# Patient Record
Sex: Female | Born: 1965 | Race: Asian | Hispanic: No | Marital: Married | Smoking: Former smoker
Health system: Southern US, Community
[De-identification: ages and names within clinical notes are randomized; demographics above are authoritative.]

## PROBLEM LIST (undated history)

## (undated) DIAGNOSIS — C801 Malignant (primary) neoplasm, unspecified: Secondary | ICD-10-CM

## (undated) DIAGNOSIS — F329 Major depressive disorder, single episode, unspecified: Secondary | ICD-10-CM

## (undated) DIAGNOSIS — E119 Type 2 diabetes mellitus without complications: Secondary | ICD-10-CM

## (undated) DIAGNOSIS — R112 Nausea with vomiting, unspecified: Secondary | ICD-10-CM

## (undated) DIAGNOSIS — R569 Unspecified convulsions: Secondary | ICD-10-CM

## (undated) DIAGNOSIS — K589 Irritable bowel syndrome without diarrhea: Secondary | ICD-10-CM

## (undated) DIAGNOSIS — Z8489 Family history of other specified conditions: Secondary | ICD-10-CM

## (undated) DIAGNOSIS — Z9889 Other specified postprocedural states: Secondary | ICD-10-CM

## (undated) DIAGNOSIS — F32A Depression, unspecified: Secondary | ICD-10-CM

## (undated) HISTORY — PX: DILATION AND CURETTAGE OF UTERUS: SHX78

## (undated) HISTORY — PX: OTHER SURGICAL HISTORY: SHX169

## (undated) HISTORY — PX: ENDOMETRIAL ABLATION: SHX621

## (undated) HISTORY — PX: LAPAROSCOPY: SHX197

---

## 1997-09-17 ENCOUNTER — Other Ambulatory Visit: Admission: RE | Admit: 1997-09-17 | Discharge: 1997-09-17 | Payer: Self-pay | Admitting: Obstetrics & Gynecology

## 1997-12-06 ENCOUNTER — Emergency Department (HOSPITAL_COMMUNITY): Admission: EM | Admit: 1997-12-06 | Discharge: 1997-12-06 | Payer: Self-pay | Admitting: Family Medicine

## 1998-09-14 ENCOUNTER — Other Ambulatory Visit: Admission: RE | Admit: 1998-09-14 | Discharge: 1998-09-14 | Payer: Self-pay | Admitting: Obstetrics & Gynecology

## 1999-09-21 ENCOUNTER — Other Ambulatory Visit: Admission: RE | Admit: 1999-09-21 | Discharge: 1999-09-21 | Payer: Self-pay | Admitting: Obstetrics & Gynecology

## 2000-11-27 ENCOUNTER — Other Ambulatory Visit: Admission: RE | Admit: 2000-11-27 | Discharge: 2000-11-27 | Payer: Self-pay | Admitting: Obstetrics & Gynecology

## 2001-04-20 ENCOUNTER — Encounter: Payer: Self-pay | Admitting: Internal Medicine

## 2001-04-20 ENCOUNTER — Encounter: Admission: RE | Admit: 2001-04-20 | Discharge: 2001-04-20 | Payer: Self-pay | Admitting: Internal Medicine

## 2002-01-03 ENCOUNTER — Other Ambulatory Visit: Admission: RE | Admit: 2002-01-03 | Discharge: 2002-01-03 | Payer: Self-pay | Admitting: Obstetrics & Gynecology

## 2002-10-29 ENCOUNTER — Encounter (HOSPITAL_COMMUNITY): Admission: RE | Admit: 2002-10-29 | Discharge: 2003-01-27 | Payer: Self-pay | Admitting: Internal Medicine

## 2002-10-30 ENCOUNTER — Encounter: Payer: Self-pay | Admitting: Internal Medicine

## 2002-11-06 ENCOUNTER — Ambulatory Visit (HOSPITAL_COMMUNITY): Admission: RE | Admit: 2002-11-06 | Discharge: 2002-11-06 | Payer: Self-pay | Admitting: Gastroenterology

## 2002-11-06 ENCOUNTER — Encounter (INDEPENDENT_AMBULATORY_CARE_PROVIDER_SITE_OTHER): Payer: Self-pay | Admitting: Specialist

## 2003-02-06 ENCOUNTER — Other Ambulatory Visit: Admission: RE | Admit: 2003-02-06 | Discharge: 2003-02-06 | Payer: Self-pay | Admitting: Obstetrics & Gynecology

## 2003-02-10 ENCOUNTER — Ambulatory Visit (HOSPITAL_COMMUNITY): Admission: RE | Admit: 2003-02-10 | Discharge: 2003-02-10 | Payer: Self-pay | Admitting: Endocrinology

## 2003-02-10 ENCOUNTER — Encounter: Payer: Self-pay | Admitting: Endocrinology

## 2004-03-16 ENCOUNTER — Other Ambulatory Visit: Admission: RE | Admit: 2004-03-16 | Discharge: 2004-03-16 | Payer: Self-pay | Admitting: Obstetrics & Gynecology

## 2004-05-28 ENCOUNTER — Encounter (INDEPENDENT_AMBULATORY_CARE_PROVIDER_SITE_OTHER): Payer: Self-pay | Admitting: *Deleted

## 2004-05-28 ENCOUNTER — Ambulatory Visit (HOSPITAL_COMMUNITY): Admission: RE | Admit: 2004-05-28 | Discharge: 2004-05-28 | Payer: Self-pay | Admitting: Obstetrics & Gynecology

## 2004-10-30 ENCOUNTER — Emergency Department (HOSPITAL_COMMUNITY): Admission: EM | Admit: 2004-10-30 | Discharge: 2004-10-30 | Payer: Self-pay | Admitting: Emergency Medicine

## 2004-12-02 ENCOUNTER — Encounter: Admission: RE | Admit: 2004-12-02 | Discharge: 2004-12-02 | Payer: Self-pay | Admitting: Internal Medicine

## 2004-12-06 ENCOUNTER — Encounter: Admission: RE | Admit: 2004-12-06 | Discharge: 2004-12-06 | Payer: Self-pay | Admitting: Gastroenterology

## 2005-04-20 ENCOUNTER — Other Ambulatory Visit: Admission: RE | Admit: 2005-04-20 | Discharge: 2005-04-20 | Payer: Self-pay | Admitting: Obstetrics & Gynecology

## 2008-01-07 ENCOUNTER — Encounter: Admission: RE | Admit: 2008-01-07 | Discharge: 2008-01-07 | Payer: Self-pay | Admitting: Obstetrics & Gynecology

## 2009-01-06 ENCOUNTER — Encounter: Admission: RE | Admit: 2009-01-06 | Discharge: 2009-01-06 | Payer: Self-pay | Admitting: Obstetrics & Gynecology

## 2009-05-26 ENCOUNTER — Observation Stay (HOSPITAL_COMMUNITY): Admission: EM | Admit: 2009-05-26 | Discharge: 2009-05-27 | Payer: Self-pay | Admitting: Emergency Medicine

## 2009-05-27 ENCOUNTER — Encounter (INDEPENDENT_AMBULATORY_CARE_PROVIDER_SITE_OTHER): Payer: Self-pay | Admitting: Internal Medicine

## 2009-07-30 ENCOUNTER — Encounter: Admission: RE | Admit: 2009-07-30 | Discharge: 2009-07-30 | Payer: Self-pay | Admitting: Obstetrics & Gynecology

## 2009-08-21 IMAGING — US US BREAST*L*
1 series · 5 of 5 positions shown · non-contrast
Comparison: Mammograms December 31, 2008,December 29, 2007,November 21, 2003.

On physical exam, no mass is palpated in the periareolar medial
left breast

CLINICAL DATA: Small nodule identified in the periareolar left
breast on recent screening mammogram on December 31, 2008.

LEFT BREAST ULTRASOUND

[Series 1: us breast*left* · 5 of 5 slices shown]
[im 1/5]
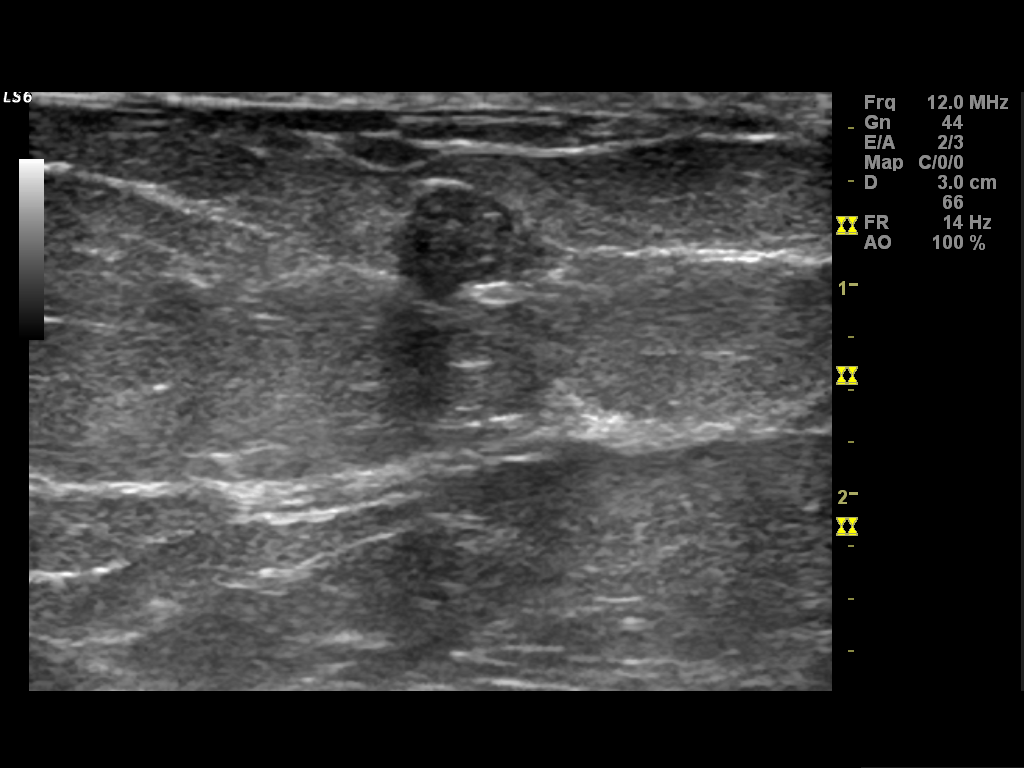
[im 2/5]
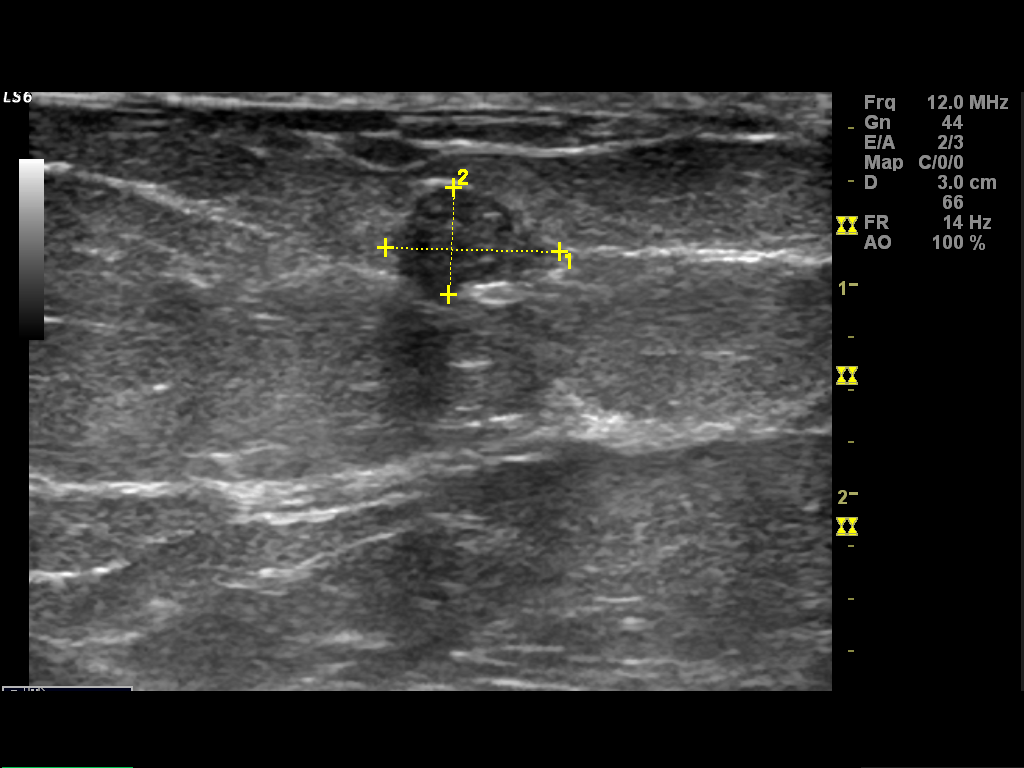
[im 3/5]
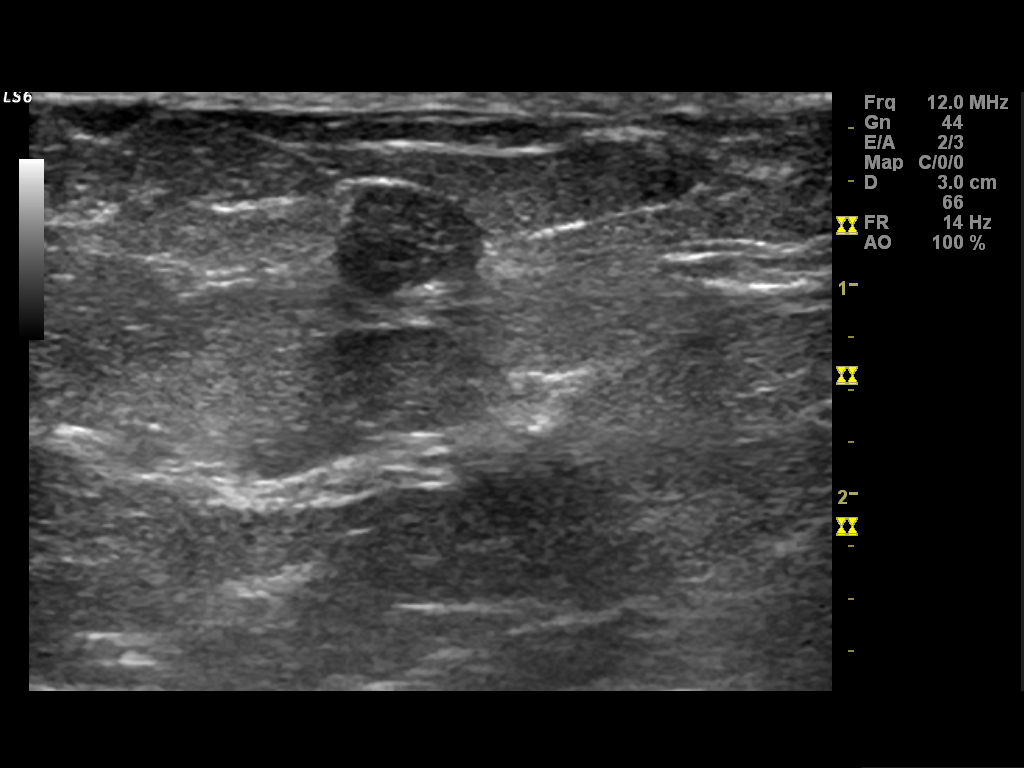
[im 4/5]
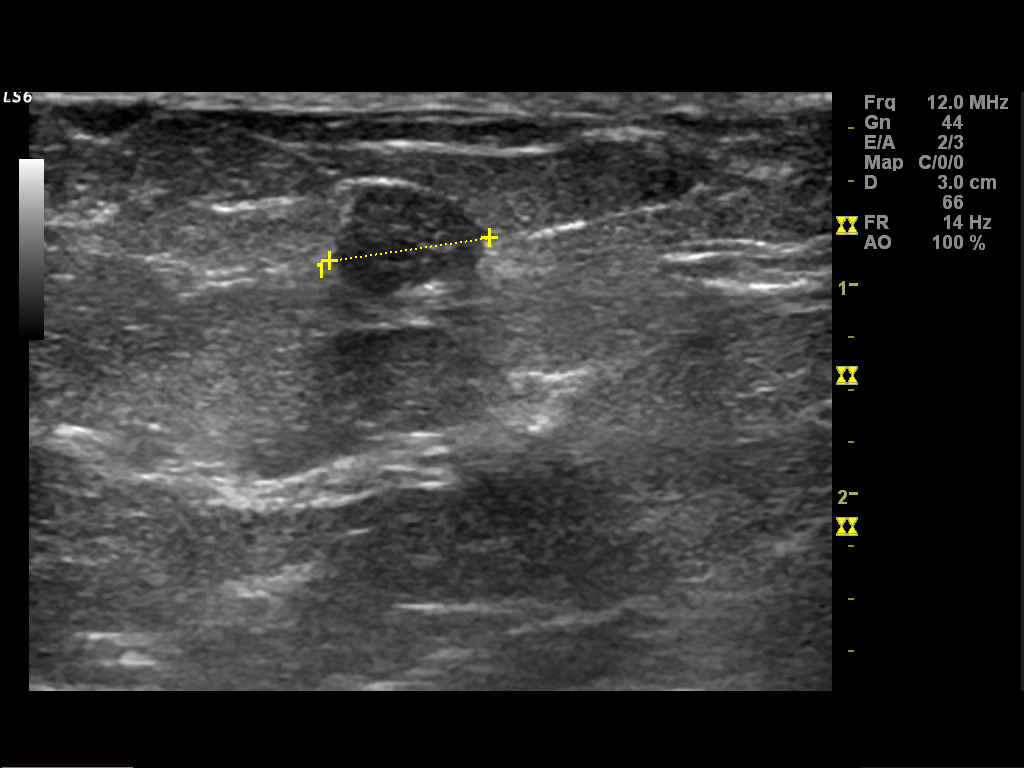
[im 5/5]
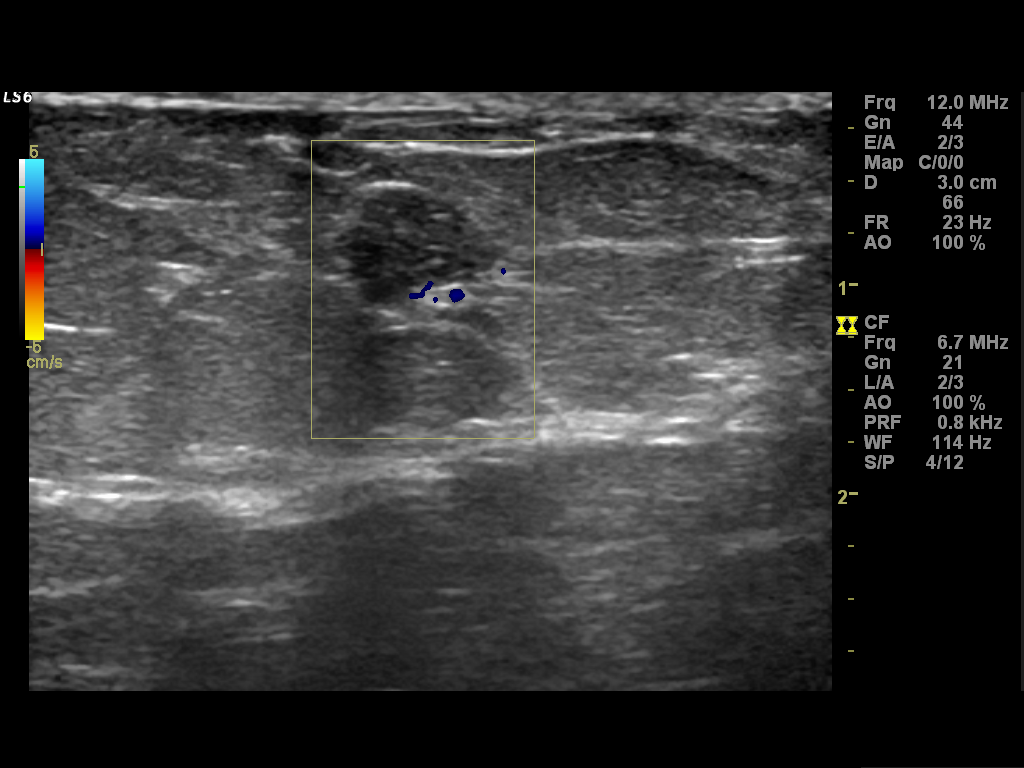

[5 of 5 positions shown; findings below may reference images not displayed]

FINDINGS: Ultrasound is performed, showing a circumscribed slightly
hypoechoic oval circumscribed mass that measures 8 x 5 x 5 mm,
located at [DATE] position 1 cm from the nipple.  On the recent
screening mammogram, this nodule appears stable compared to the
December 2007 mammograms.  This is favored to be a benign nodule,
likely a small fibroadenoma.
IMPRESSION: Probably benign 8 mm nodule [DATE] position left breast periareolar.
A follow-up left breast ultrasound in 6 months is recommended.  The
option of ultrasound-guided core needle biopsy is also discussed
with the patient, but she prefers imaging follow-up.

BI-RADS CATEGORY 3:  Probably benign finding(s) - short interval
follow-up suggested.

.

## 2010-06-14 ENCOUNTER — Encounter: Payer: Self-pay | Admitting: Obstetrics & Gynecology

## 2010-08-08 LAB — CULTURE, BLOOD (ROUTINE X 2)
Culture: NO GROWTH
Culture: NO GROWTH

## 2010-08-08 LAB — COMPREHENSIVE METABOLIC PANEL
AST: 31 U/L (ref 0–37)
Albumin: 3.6 g/dL (ref 3.5–5.2)
Alkaline Phosphatase: 71 U/L (ref 39–117)
BUN: 8 mg/dL (ref 6–23)
Calcium: 8.6 mg/dL (ref 8.4–10.5)
Creatinine, Ser: 0.78 mg/dL (ref 0.4–1.2)
GFR calc non Af Amer: >60 mL/min (ref >60)

## 2010-08-08 LAB — URINE CULTURE
Colony Count: NO GROWTH
Culture: NO GROWTH

## 2010-08-08 LAB — CBC
HCT: 42.5 % (ref 36.0–46.0)
Hemoglobin: 11.8 g/dL — ABNORMAL LOW (ref 12.0–15.0)
Hemoglobin: 14.4 g/dL (ref 12.0–15.0)
MCHC: 33.6 g/dL (ref 30.0–36.0)
MCHC: 33.8 g/dL (ref 30.0–36.0)
MCV: 83.5 fL (ref 78.0–100.0)
MCV: 83.9 fL (ref 78.0–100.0)
Platelets: 315 K/uL (ref 150–400)
RBC: 5.09 MIL/uL (ref 3.87–5.11)
RDW: 15.1 % (ref 11.5–15.5)
WBC: 13 K/uL — ABNORMAL HIGH (ref 4.0–10.5)

## 2010-08-08 LAB — URINALYSIS, ROUTINE W REFLEX MICROSCOPIC
Bilirubin Urine: NEGATIVE
Glucose, UA: NEGATIVE mg/dL
Hgb urine dipstick: NEGATIVE
Ketones, ur: NEGATIVE mg/dL
Nitrite: NEGATIVE
Protein, ur: NEGATIVE mg/dL
Specific Gravity, Urine: 1.013 (ref 1.005–1.030)
Urobilinogen, UA: 1 mg/dL (ref 0.0–1.0)
pH: 6 (ref 5.0–8.0)

## 2010-08-08 LAB — COMPREHENSIVE METABOLIC PANEL WITH GFR
ALT: 20 U/L (ref 0–35)
CO2: 25 meq/L (ref 19–32)
Chloride: 104 meq/L (ref 96–112)
GFR calc Af Amer: >60 mL/min (ref >60)
Glucose, Bld: 112 mg/dL — ABNORMAL HIGH (ref 70–99)
Potassium: 2.9 meq/L — ABNORMAL LOW (ref 3.5–5.1)
Sodium: 138 meq/L (ref 135–145)
Total Bilirubin: 0.8 mg/dL (ref 0.3–1.2)
Total Protein: 6.9 g/dL (ref 6.0–8.3)

## 2010-08-08 LAB — DIFFERENTIAL
Basophils Absolute: 0 K/uL (ref 0.0–0.1)
Basophils Relative: 0 % (ref 0–1)
Eosinophils Absolute: 0 K/uL (ref 0.0–0.7)
Eosinophils Relative: 0 % (ref 0–5)
Lymphocytes Relative: 3 % — ABNORMAL LOW (ref 12–46)
Lymphs Abs: 0.4 K/uL — ABNORMAL LOW (ref 0.7–4.0)
Monocytes Absolute: 0.1 10*3/uL (ref 0.1–1.0)
Monocytes Relative: 1 % — ABNORMAL LOW (ref 3–12)
Neutro Abs: 12.4 10*3/uL — ABNORMAL HIGH (ref 1.7–7.7)
Neutrophils Relative %: 96 % — ABNORMAL HIGH (ref 43–77)

## 2010-08-08 LAB — D-DIMER, QUANTITATIVE: D-Dimer, Quant: 0.45 ug{FEU}/mL (ref 0.00–0.48)

## 2010-08-08 LAB — LIPASE, BLOOD: Lipase: 26 U/L (ref 11–59)

## 2010-08-08 LAB — URINE MICROSCOPIC-ADD ON

## 2010-08-08 LAB — BASIC METABOLIC PANEL
BUN: 4 mg/dL — ABNORMAL LOW (ref 6–23)
CO2: 18 mEq/L — ABNORMAL LOW (ref 19–32)
Calcium: 7.7 mg/dL — ABNORMAL LOW (ref 8.4–10.5)
Creatinine, Ser: 0.79 mg/dL (ref 0.4–1.2)
GFR calc Af Amer: >60 mL/min (ref >60)
Glucose, Bld: 156 mg/dL — ABNORMAL HIGH (ref 70–99)
Potassium: 3.9 mEq/L (ref 3.5–5.1)
Sodium: 135 mEq/L (ref 135–145)

## 2010-08-08 LAB — LACTIC ACID, PLASMA: Lactic Acid, Venous: 4 mmol/L — ABNORMAL HIGH (ref 0.5–2.2)

## 2010-10-08 NOTE — Op Note (Signed)
NAMEZARRA, GEFFERT NO.:  [id number]   MEDICAL RECORD NO.:  [id number]          PATIENT TYPE:  AMB   LOCATION:  SDC                           FACILITY:  [facility]   PHYSICIAN:  Freddy Finner, M.D.   DATE OF BIRTH:  03-08-1966   DATE OF PROCEDURE:  05/28/2004  DATE OF DISCHARGE:                                 OPERATIVE REPORT   PREOPERATIVE DIAGNOSIS:  Menorrhagia.   POSTOPERATIVE DIAGNOSIS:  Menorrhagia.   OPERATIVE PROCEDURE:  Hysteroscopy, D&C, and NovaSure endometrial ablation.   ANESTHESIA:  General.   ESTIMATED INTRAOPERATIVE BLOOD LOSS:  10 cubic centimeters or less.   LACTATED RINGERS DEFICIT:  Was 60 cubic centimeters.   INTRAOPERATIVE COMPLICATIONS:  None.   The patient is a 45 year old with menorrhagia with no ultrasound histogram  evidence of intracavitary abnormality.  Clinically, the uterus is a little  boggy and slightly enlarged but there was no uterine pathology except for  some small intramural myomata.  The patient is admitted now for treatment of  menorrhagia.   She was admitted on the morning of surgery.  She was given an antibiotic  bolus preoperatively.  She was brought to the operating room, placed under  adequate general anesthesia, placed in the dorsal lithotomy position using  the Dry Prong stirrups system.  Betadine prep was carried out in the usual  fashion.  The bivalved speculum was introduced.  The cervix was visualized  and grasped on the anterior lip with a single-toothed tenaculum.  The cervix  sounded to 2.5 cm.  The cervix sounded to 8.5 cm.  The cervix was  progressively dilated with Shawnie Pons dilators to #23.  The uterus was  retroverted.  Inspection with the hysteroscope using lactated Ringer  solution revealed a normal appearing cavity.  Gentle, thorough curettage was  carried with exploration with the Randall stone forceps to recover  endometrial tissue.  The NovaSure device was then applied.  The cavity  length was 6.5,  cavity width was 2.8.  Power consumed was 100.  Time of  ablation was one minute 30 seconds.  After adequately applying and testing  of the device, the NovaSure ablation was carried out without difficulty.  Re-  inspection of the cavity revealed complete ablation of the endometrium.  The  procedure was terminated.  The instruments were removed and the patient was  awakened, taken to the recovery room in good condition.  She will be  discharged in the immediate postoperative period for follow up in the office  in approximately two weeks.  She was given Toradol 30 mg IV, 30 mg IM  immediately after completing the procedure for postoperative analgesia.     Hosie Spangle   WRN/MEDQ  D:  05/28/2004  T:  05/28/2004  Job:  [id number]

## 2010-10-08 NOTE — Op Note (Signed)
NAMENICOL, HERBIG NO.:  [id number]   MEDICAL RECORD NO.:  [id number]                   PATIENT TYPE:  AMB   LOCATION:  ENDO                                 FACILITY:  [facility]   PHYSICIAN:  Danise Edge, M.D.                DATE OF BIRTH:  [birth year]   DATE OF PROCEDURE:  11/06/2002  DATE OF DISCHARGE:                                 OPERATIVE REPORT   REFERRING PHYSICIAN:  Candyce Churn, M.D.   PROCEDURE:  Esophagogastroduodenoscopy.   PROCEDURE INDICATION:  Ms. Megan Clark is a 45 year old female, born May 20, 1966.  Ms. Megan Clark has epigastric pain despite taking Nexium.  She reports  no heartburn, dysphagia, or odynophagia.  In the past, she was told she had  peptic ulcer disease by upper GI x-ray series.  She tells me her gallbladder  ultrasound a year ago was normal.   ENDOSCOPIST:  Charolett Bumpers, M.D.   PREMEDICATION:  1. Versed 10 mg.  2. Fentanyl 100 mcg.   DESCRIPTION OF PROCEDURE:  After obtaining informed consent, Ms. Megan Clark was  placed in the left lateral decubitus position.  I administered intravenous  fentanyl and intravenous Versed to achieve conscious sedation for the  procedure.  The patient's blood pressure, oxygen saturation, and cardiac  rhythm were monitored throughout the procedure and documented in the medical  record.   The Olympus gastroscope was passed through the posterior hypopharynx into  the proximal esophagus without difficulty.  The hypopharynx, larynx, and  vocal cords appeared normal.   ESOPHAGOSCOPY:  Inspection of the proximal, mid, and lower segments of the  esophageal mucosa was completely normal.  The squamocolumnar junction and  esophagogastric junction are noted at 35 cm from the incisor teeth.   GASTROSCOPY:  Retroflexed view of the gastric cardia and fundus was normal.  In the proximal gastric body, there are a few scattered, 1mm sessile polyps  which were biopsied.  The gastric  antrum, and pylorus appear normal.   DUODENOSCOPY:  The duodenal bulb, mid duodenum, distal duodenum, and  proximal jejunum appear normal.    ASSESSMENT:  Essentially normal esophagogastroduodenoscopy except for the  presence of a few diminutive gastric body polyps which were biopsied.  I  suspect these represent gastric glands but if H. pylori is present on the  microscope, I would recommend treatment to eradicate H. pylori.                                               Danise Edge, M.D.    MJ/MEDQ  D:  11/06/2002  T:  11/06/2002  Job:  [id number]   cc:   Candyce Churn, M.D.  [address] [place]  [place] [address]  Fax: [phone]

## 2013-11-24 ENCOUNTER — Other Ambulatory Visit: Payer: Self-pay | Admitting: Oncology

## 2013-12-05 ENCOUNTER — Other Ambulatory Visit: Payer: Self-pay | Admitting: Obstetrics & Gynecology

## 2013-12-05 DIAGNOSIS — R928 Other abnormal and inconclusive findings on diagnostic imaging of breast: Secondary | ICD-10-CM

## 2014-01-06 ENCOUNTER — Ambulatory Visit
Admission: RE | Admit: 2014-01-06 | Discharge: 2014-01-06 | Disposition: A | Discharge status: Home / Self Care | Payer: 59 | Source: Ambulatory Visit | Attending: Obstetrics & Gynecology | Admitting: Obstetrics & Gynecology

## 2014-01-06 ENCOUNTER — Other Ambulatory Visit: Payer: Self-pay | Admitting: Obstetrics & Gynecology

## 2014-01-06 ENCOUNTER — Encounter (INDEPENDENT_AMBULATORY_CARE_PROVIDER_SITE_OTHER): Payer: Self-pay

## 2014-01-06 DIAGNOSIS — N6310 Unspecified lump in the right breast, unspecified quadrant: Secondary | ICD-10-CM

## 2014-01-06 DIAGNOSIS — R928 Other abnormal and inconclusive findings on diagnostic imaging of breast: Secondary | ICD-10-CM

## 2014-07-16 ENCOUNTER — Other Ambulatory Visit: Payer: Self-pay | Admitting: Obstetrics & Gynecology

## 2014-07-16 DIAGNOSIS — N63 Unspecified lump in unspecified breast: Secondary | ICD-10-CM

## 2014-08-28 ENCOUNTER — Ambulatory Visit
Admission: RE | Admit: 2014-08-28 | Discharge: 2014-08-28 | Disposition: A | Discharge status: Home / Self Care | Payer: 59 | Source: Ambulatory Visit | Attending: Obstetrics & Gynecology | Admitting: Obstetrics & Gynecology

## 2014-08-28 DIAGNOSIS — N63 Unspecified lump in unspecified breast: Secondary | ICD-10-CM

## 2014-12-16 ENCOUNTER — Other Ambulatory Visit: Payer: Self-pay | Admitting: *Deleted

## 2014-12-16 MED ORDER — FLUCONAZOLE 100 MG PO TABS: 100.0000 mg | ORAL_TABLET | Freq: Every day | ORAL | Status: DC

## 2015-01-28 ENCOUNTER — Other Ambulatory Visit: Payer: Self-pay | Admitting: Obstetrics & Gynecology

## 2015-01-29 LAB — CYTOLOGY - PAP

## 2015-03-19 ENCOUNTER — Other Ambulatory Visit: Payer: Self-pay | Admitting: Gastroenterology

## 2015-04-12 IMAGING — MG MM DIAG BREAST TOMO UNI RIGHT
4 series · 4 of 12 positions shown · non-contrast
Comparison: 01/06/2014 and earlier

CLINICAL DATA: Six-month follow-up following benign right breast
biopsy showing fibroadenoma.

EXAM:
DIGITAL DIAGNOSTIC RIGHT MAMMOGRAM WITH 3D TOMOSYNTHESIS AND CAD

[R MLO]
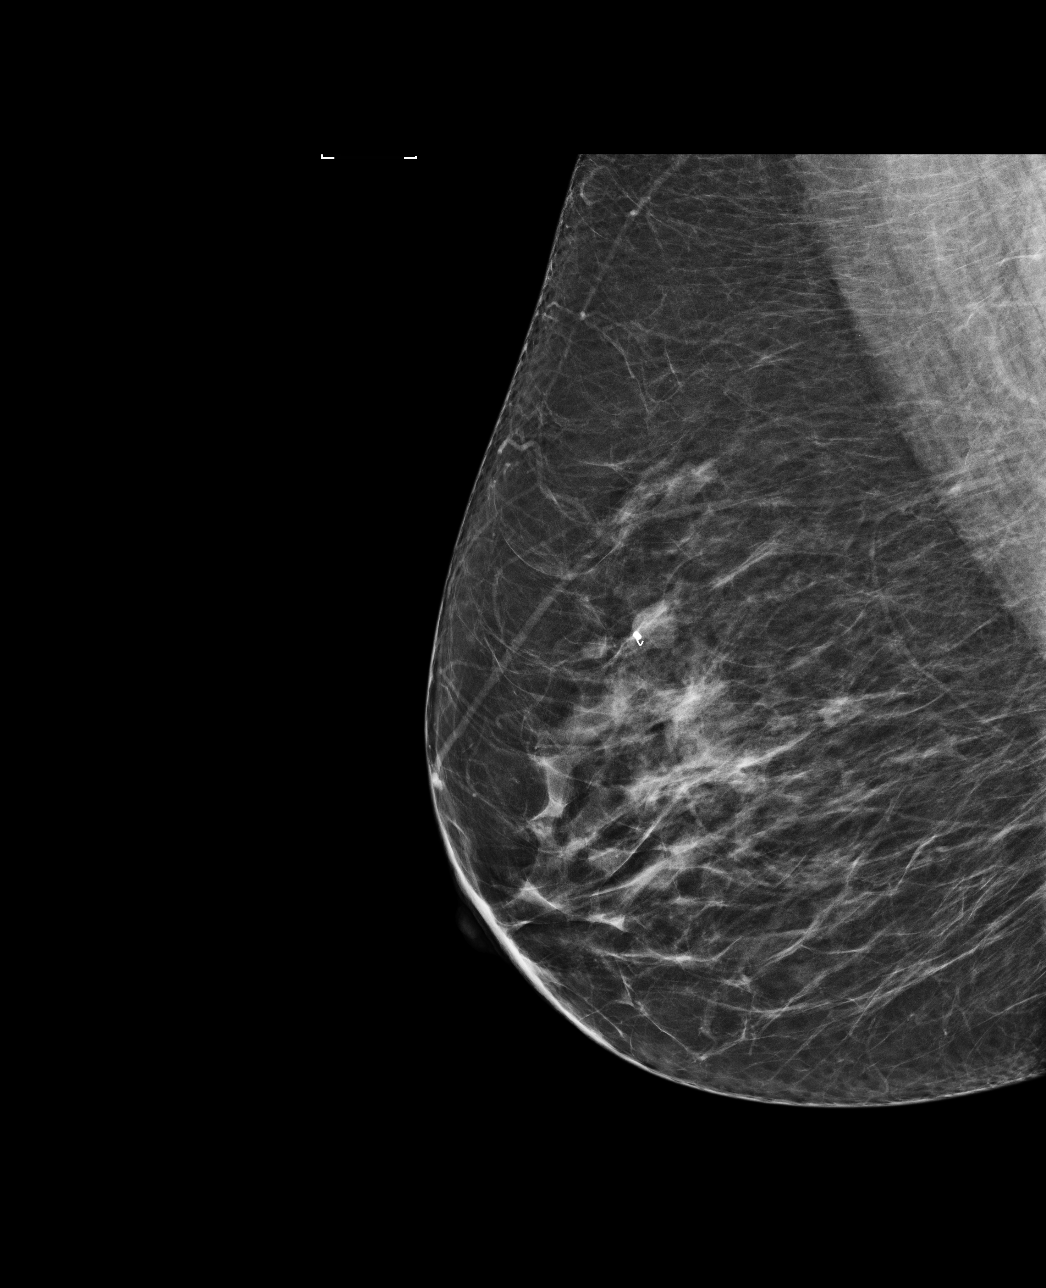

[R CC]
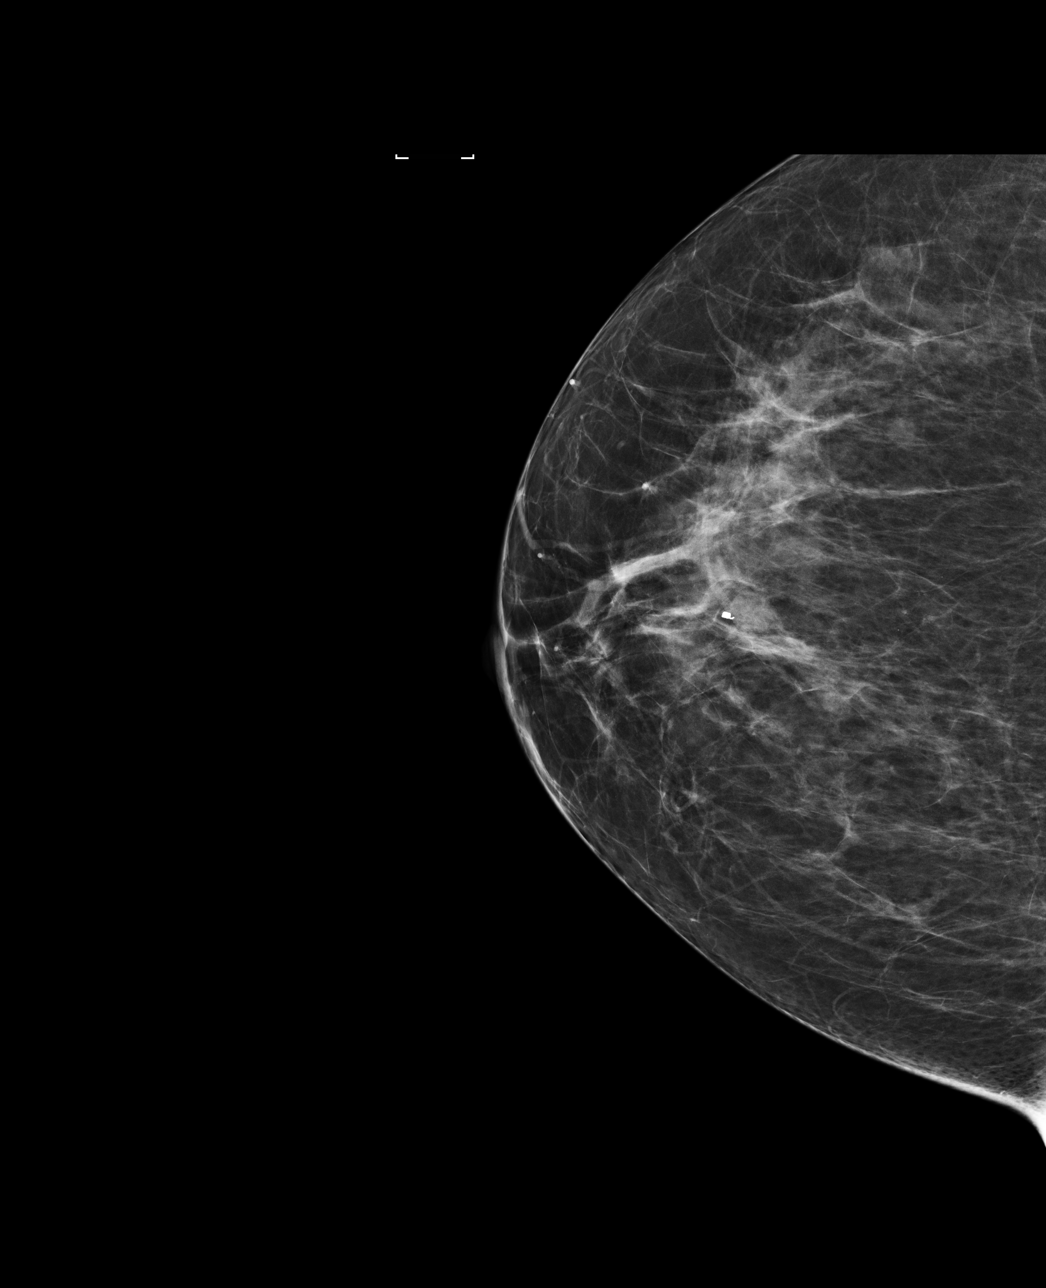

[R CC tomo · tomo slice 40/79.0]
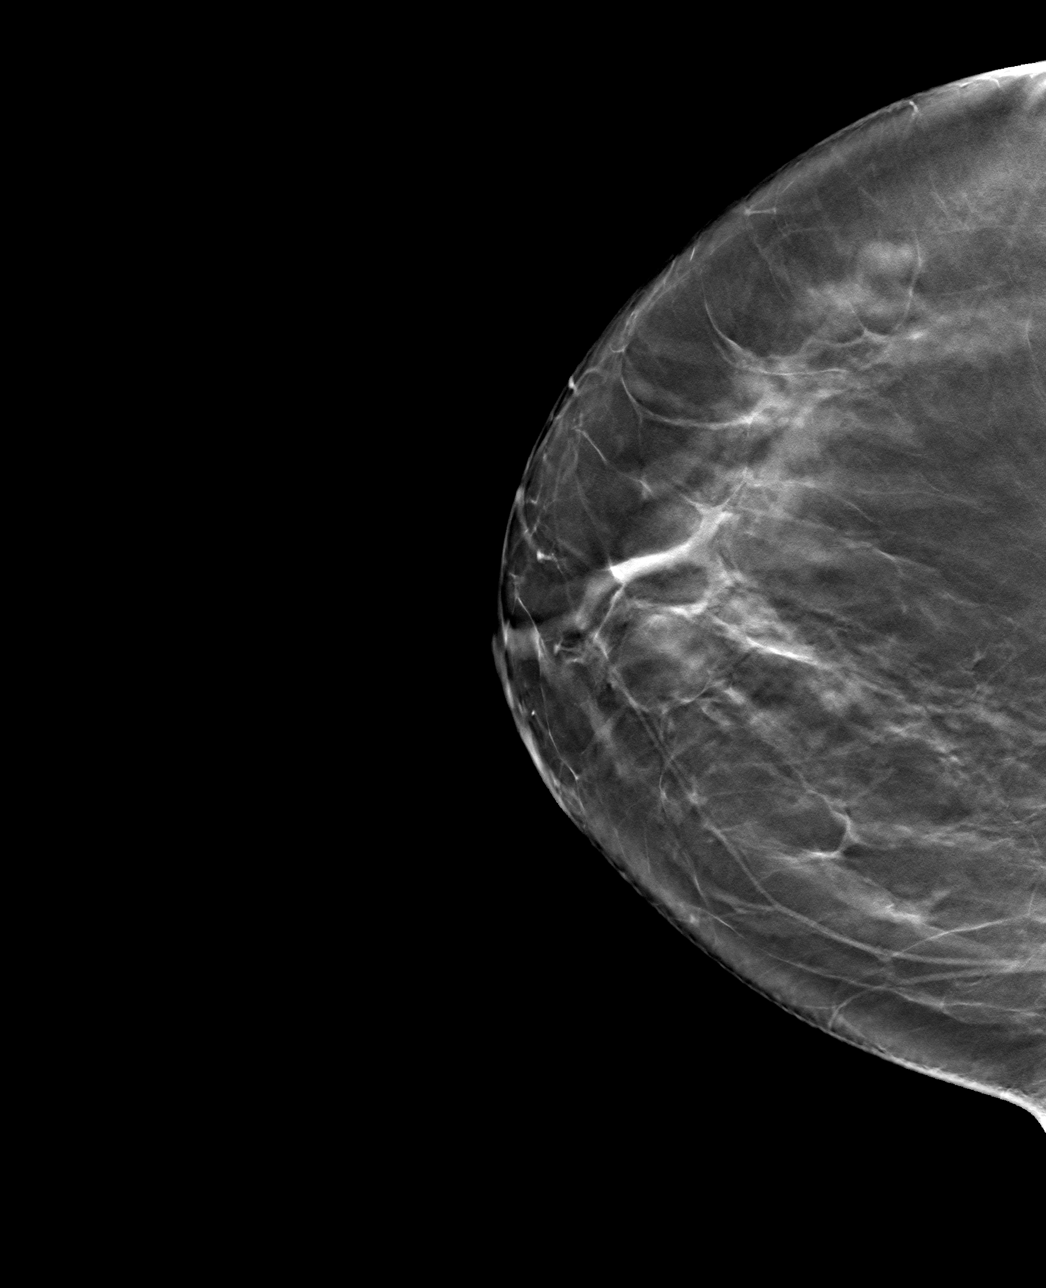

[R MLO tomo · tomo slice 39/77.0]
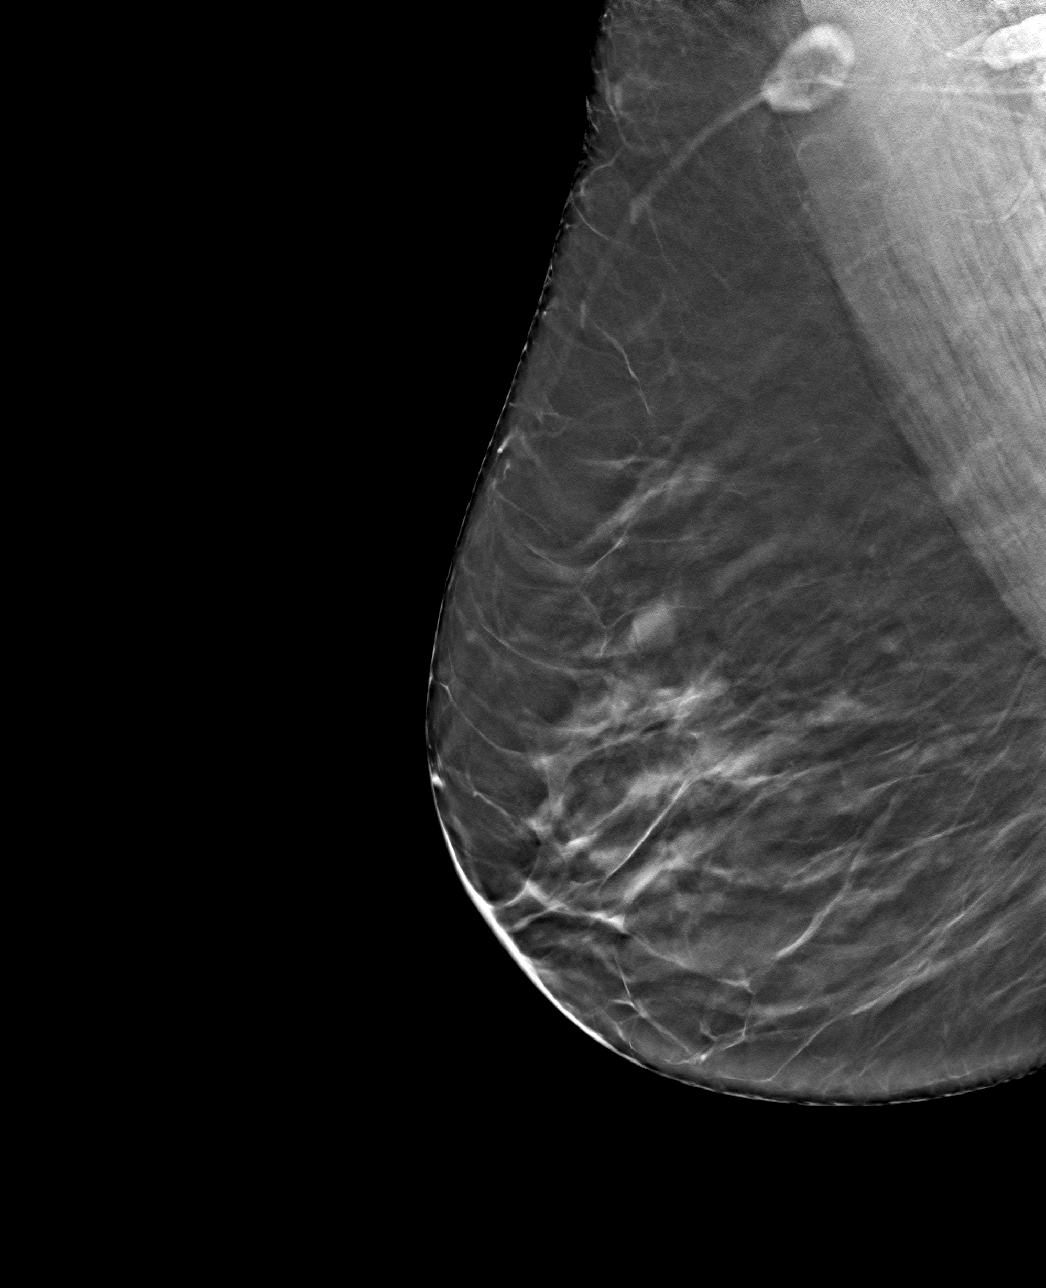

[4 of 12 positions shown; findings below may reference images not displayed]

ACR Breast Density Category c: The breast tissue is heterogeneously
dense, which may obscure small masses.
FINDINGS: Tissue marker clip is identified in the upper-outer quadrant of the
right breast, associated with a small circumscribed nodule. No new
mass, distortion, or microcalcifications are identified to suggest
presence of malignancy.

Mammographic images were processed with CAD.
IMPRESSION: No mammographic evidence for malignancy.

RECOMMENDATION:
Bilateral screening mammogram is recommended in December 2014.

I have discussed the findings and recommendations with the patient.
Results were also provided in writing at the conclusion of the
visit. If applicable, a reminder letter will be sent to the patient
regarding the next appointment.

BI-RADS CATEGORY  2: Benign.

## 2015-06-03 MED FILL — MINIVELLE 0.1 MG PATCH: 0.1 | 28 days supply | Qty: 8 | Fill #1

## 2015-06-04 ENCOUNTER — Encounter (HOSPITAL_COMMUNITY): Payer: Self-pay | Admitting: *Deleted

## 2015-06-08 ENCOUNTER — Ambulatory Visit (HOSPITAL_COMMUNITY)
Admission: RE | Admit: 2015-06-08 | Discharge: 2015-06-08 | Disposition: A | Discharge status: Home / Self Care | Payer: 59 | Source: Ambulatory Visit | Attending: Gastroenterology | Admitting: Gastroenterology

## 2015-06-08 ENCOUNTER — Ambulatory Visit (HOSPITAL_COMMUNITY): Payer: 59 | Admitting: Anesthesiology

## 2015-06-08 ENCOUNTER — Encounter (HOSPITAL_COMMUNITY): Payer: Self-pay | Admitting: *Deleted

## 2015-06-08 ENCOUNTER — Encounter (HOSPITAL_COMMUNITY)
Admission: RE | Disposition: A | Discharge status: Home / Self Care | Payer: Self-pay | Source: Ambulatory Visit | Attending: Gastroenterology

## 2015-06-08 DIAGNOSIS — E119 Type 2 diabetes mellitus without complications: Secondary | ICD-10-CM | POA: Insufficient documentation

## 2015-06-08 DIAGNOSIS — D123 Benign neoplasm of transverse colon: Secondary | ICD-10-CM | POA: Insufficient documentation

## 2015-06-08 DIAGNOSIS — Z1211 Encounter for screening for malignant neoplasm of colon: Secondary | ICD-10-CM | POA: Insufficient documentation

## 2015-06-08 DIAGNOSIS — K219 Gastro-esophageal reflux disease without esophagitis: Secondary | ICD-10-CM | POA: Diagnosis not present

## 2015-06-08 DIAGNOSIS — K589 Irritable bowel syndrome without diarrhea: Secondary | ICD-10-CM | POA: Diagnosis not present

## 2015-06-08 DIAGNOSIS — E739 Lactose intolerance, unspecified: Secondary | ICD-10-CM | POA: Insufficient documentation

## 2015-06-08 DIAGNOSIS — Z87891 Personal history of nicotine dependence: Secondary | ICD-10-CM | POA: Insufficient documentation

## 2015-06-08 HISTORY — DX: Type 2 diabetes mellitus without complications: E11.9

## 2015-06-08 HISTORY — DX: Irritable bowel syndrome, unspecified: K58.9

## 2015-06-08 HISTORY — DX: Other specified postprocedural states: Z98.890

## 2015-06-08 HISTORY — DX: Malignant (primary) neoplasm, unspecified: C80.1

## 2015-06-08 HISTORY — PX: COLONOSCOPY WITH PROPOFOL: SHX5780

## 2015-06-08 HISTORY — DX: Unspecified convulsions: R56.9

## 2015-06-08 HISTORY — DX: Other specified postprocedural states: R11.2

## 2015-06-08 HISTORY — DX: Family history of other specified conditions: Z84.89

## 2015-06-08 HISTORY — DX: Depression, unspecified: F32.A

## 2015-06-08 HISTORY — DX: Major depressive disorder, single episode, unspecified: F32.9

## 2015-06-08 LAB — GLUCOSE, CAPILLARY: Glucose-Capillary: 110 mg/dL — ABNORMAL HIGH (ref 65–99)

## 2015-06-08 SURGERY — COLONOSCOPY WITH PROPOFOL
Anesthesia: Monitor Anesthesia Care

## 2015-06-08 MED ORDER — LACTATED RINGERS IV SOLN
Freq: Once | INTRAVENOUS | Status: AC
  Administered 2015-06-08: 09:00:00 via INTRAVENOUS

## 2015-06-08 MED ORDER — PROPOFOL 10 MG/ML IV BOLUS
INTRAVENOUS | Status: AC
  Filled 2015-06-08: qty 40

## 2015-06-08 MED ORDER — LIDOCAINE HCL (CARDIAC) 20 MG/ML IV SOLN
INTRAVENOUS | Status: DC | PRN
  Administered 2015-06-08: 50 mg via INTRAVENOUS

## 2015-06-08 MED ORDER — SODIUM CHLORIDE 0.9 % IV SOLN: INTRAVENOUS | Status: DC

## 2015-06-08 MED ORDER — LACTATED RINGERS IV SOLN: Freq: Once | INTRAVENOUS | Status: DC

## 2015-06-08 MED ORDER — PROPOFOL 10 MG/ML IV BOLUS
INTRAVENOUS | Status: DC | PRN
  Administered 2015-06-08 (×4): 20 mg via INTRAVENOUS
  Administered 2015-06-08: 30 mg via INTRAVENOUS
  Administered 2015-06-08: 20 mg via INTRAVENOUS
  Administered 2015-06-08: 50 mg via INTRAVENOUS
  Administered 2015-06-08 (×2): 30 mg via INTRAVENOUS

## 2015-06-08 MED ORDER — LIDOCAINE HCL (CARDIAC) 20 MG/ML IV SOLN
INTRAVENOUS | Status: AC
  Filled 2015-06-08: qty 5

## 2015-06-08 SURGICAL SUPPLY — 22 items

## 2015-06-08 NOTE — H&P (Signed)
  Procedure: Screening colonoscopy. Normal screening colonoscopy performed on 12/09/2004  History: The patient is a 50 year old female born 1966/05/18. She is scheduled to undergo a screening colonoscopy today.  Allergies: Sulfa caused anaphylaxis  Past medical history: Gastroesophageal reflux. Iron deficiency anemia. Irritable bowel syndrome. Lactose intolerance.  Exam: The patient is alert and lying comfortably on the endoscopy stretcher. Abdomen is soft and nontender to palpation. Cardiac exam reveals a regular rhythm. Lungs are clear to auscultation.  Plan: Proceed with screening colonoscopy

## 2015-06-08 NOTE — Transfer of Care (Signed)
Immediate Anesthesia Transfer of Care Note  Patient: Megan Clark  Procedure(s) Performed: Procedure(s): COLONOSCOPY WITH PROPOFOL (N/A)  Patient Location: PACU  Anesthesia Type:MAC  Level of Consciousness: Patient easily awoken, sedated, comfortable, cooperative, following commands, responds to stimulation.   Airway & Oxygen Therapy: Patient spontaneously breathing, ventilating well, oxygen via simple oxygen mask.  Post-op Assessment: Report given to PACU RN, vital signs reviewed and stable, moving all extremities.   Post vital signs: Reviewed and stable.  Complications: No apparent anesthesia complications

## 2015-06-08 NOTE — Anesthesia Preprocedure Evaluation (Signed)
Anesthesia Evaluation  Patient identified by MRN, date of birth, ID band Patient awake    Reviewed: Allergy & Precautions, NPO status , Patient's Chart, lab work & pertinent test results  History of Anesthesia Complications (+) PONV and history of anesthetic complications ("father was slow to wake up")  Airway Mallampati: II  TM Distance: >3 FB Neck ROM: Full    Dental no notable dental hx. (+) Dental Advisory Given   Pulmonary former smoker,    Pulmonary exam normal breath sounds clear to auscultation       Cardiovascular negative cardio ROS Normal cardiovascular exam Rhythm:Regular Rate:Normal     Neuro/Psych Seizures - (at age 50, none since),  PSYCHIATRIC DISORDERS Anxiety Depression    GI/Hepatic negative GI ROS, Neg liver ROS,   Endo/Other  diabetes  Renal/GU negative Renal ROS  negative genitourinary   Musculoskeletal negative musculoskeletal ROS (+)   Abdominal   Peds negative pediatric ROS (+)  Hematology negative hematology ROS (+)   Anesthesia Other Findings   Reproductive/Obstetrics negative OB ROS                             Anesthesia Physical Anesthesia Plan  ASA: II  Anesthesia Plan: MAC   Post-op Pain Management:    Induction: Intravenous  Airway Management Planned: Nasal Cannula  Additional Equipment:   Intra-op Plan:   Post-operative Plan:   Informed Consent: I have reviewed the patients History and Physical, chart, labs and discussed the procedure including the risks, benefits and alternatives for the proposed anesthesia with the patient or authorized representative who has indicated his/her understanding and acceptance.   Dental advisory given  Plan Discussed with: CRNA  Anesthesia Plan Comments:         Anesthesia Quick Evaluation

## 2015-06-08 NOTE — Discharge Instructions (Signed)
Colonoscopy, Care After °Refer to this sheet in the next few weeks. These instructions provide you with information on caring for yourself after your procedure. Your health care provider may also give you more specific instructions. Your treatment has been planned according to current medical practices, but problems sometimes occur. Call your health care provider if you have any problems or questions after your procedure. °WHAT TO EXPECT AFTER THE PROCEDURE  °After your procedure, it is typical to have the following: °· A small amount of blood in your stool. °· Moderate amounts of gas and mild abdominal cramping or bloating. °HOME CARE INSTRUCTIONS °· Do not drive, operate machinery, or sign important documents for 24 hours. °· You may shower and resume your regular physical activities, but move at a slower pace for the first 24 hours. °· Take frequent rest periods for the first 24 hours. °· Walk around or put a warm pack on your abdomen to help reduce abdominal cramping and bloating. °· Drink enough fluids to keep your urine clear or pale yellow. °· You may resume your normal diet as instructed by your health care provider. Avoid heavy or fried foods that are hard to digest. °· Avoid drinking alcohol for 24 hours or as instructed by your health care provider. °· Only take over-the-counter or prescription medicines as directed by your health care provider. °· If a tissue sample (biopsy) was taken during your procedure: °¨ Do not take aspirin or blood thinners for 7 days, or as instructed by your health care provider. °¨ Do not drink alcohol for 7 days, or as instructed by your health care provider. °¨ Eat soft foods for the first 24 hours. °SEEK MEDICAL CARE IF: °You have persistent spotting of blood in your stool 2-3 days after the procedure. °SEEK IMMEDIATE MEDICAL CARE IF: °· You have more than a small spotting of blood in your stool. °· You pass large blood clots in your stool. °· Your abdomen is swollen  (distended). °· You have nausea or vomiting. °· You have a fever. °· You have increasing abdominal pain that is not relieved with medicine. °  °This information is not intended to replace advice given to you by your health care provider. Make sure you discuss any questions you have with your health care provider. °  °Document Released: 12/22/2003 Document Revised: 02/27/2013 Document Reviewed: 01/14/2013 °Elsevier Interactive Patient Education ©2016 [employer] ° °

## 2015-06-08 NOTE — Op Note (Signed)
Procedure: Screening colonoscopy. Normal screening colonoscopy performed on 12/09/2004  Endoscopist: Earle Gell  Premedication: Propofol administered by anesthesia  Procedure: The patient was placed in the left lateral decubitus position. Anal inspection and digital rectal exam were normal. The Pentax pediatric colonoscope was introduced into the rectum and advanced to the cecum. A normal-appearing appendiceal orifice was identified. A normal-appearing ileocecal valve was intubated and the terminal ileum inspected. Colonic preparation for the exam today was good. Withdrawal time was 11 minutes  Rectum. Normal. Retroflexed view of the distal rectum was normal  Sigmoid colon and descending colon. Normal  Splenic flexure. Normal  Transverse colon. From the proximal transverse colon, and millimeters sessile polyp was removed with cold biopsy forceps  Hepatic flexure. Normal  Ascending colon. Normal  Cecum and ileocecal valve. Normal  Terminal ileum. Normal    Assessment: A diminutive polyp was removed from the proximal transverse colon. Otherwise normal colonoscopy  Recommendation: If the polyp returns adenomatous pathologically, scheduled a repeat colonoscopy in 5 years

## 2015-06-08 NOTE — Anesthesia Postprocedure Evaluation (Signed)
Anesthesia Post Note  Patient: Megan Clark  Procedure(s) Performed: Procedure(s) (LRB): COLONOSCOPY WITH PROPOFOL (N/A)  Patient location during evaluation: PACU Anesthesia Type: MAC Level of consciousness: awake and alert Pain management: pain level controlled Vital Signs Assessment: post-procedure vital signs reviewed and stable Respiratory status: spontaneous breathing, nonlabored ventilation, respiratory function stable and patient connected to nasal cannula oxygen Cardiovascular status: blood pressure returned to baseline and stable Postop Assessment: no signs of nausea or vomiting Anesthetic complications: no    Last Vitals:  Filed Vitals:   06/08/15 1030 06/08/15 1034  BP: 133/81 133/81  Pulse: 69 67  Temp:    Resp: 17 16    Last Pain: There were no vitals filed for this visit.               [name]

## 2015-06-09 ENCOUNTER — Encounter (HOSPITAL_COMMUNITY): Payer: Self-pay | Admitting: Gastroenterology

## 2015-07-03 MED FILL — MINIVELLE 0.1 MG PATCH: 0.1 | 28 days supply | Qty: 8 | Fill #2

## 2015-07-17 DIAGNOSIS — R7303 Prediabetes: Secondary | ICD-10-CM | POA: Diagnosis not present

## 2015-07-17 DIAGNOSIS — R030 Elevated blood-pressure reading, without diagnosis of hypertension: Secondary | ICD-10-CM | POA: Diagnosis not present

## 2015-07-17 DIAGNOSIS — D509 Iron deficiency anemia, unspecified: Secondary | ICD-10-CM | POA: Diagnosis not present

## 2015-07-17 DIAGNOSIS — E0590 Thyrotoxicosis, unspecified without thyrotoxic crisis or storm: Secondary | ICD-10-CM | POA: Diagnosis not present

## 2015-07-17 DIAGNOSIS — Z79899 Other long term (current) drug therapy: Secondary | ICD-10-CM | POA: Diagnosis not present

## 2015-07-17 DIAGNOSIS — E559 Vitamin D deficiency, unspecified: Secondary | ICD-10-CM | POA: Diagnosis not present

## 2015-07-17 DIAGNOSIS — F329 Major depressive disorder, single episode, unspecified: Secondary | ICD-10-CM | POA: Diagnosis not present

## 2015-07-20 DIAGNOSIS — E559 Vitamin D deficiency, unspecified: Secondary | ICD-10-CM | POA: Diagnosis not present

## 2015-07-20 DIAGNOSIS — D509 Iron deficiency anemia, unspecified: Secondary | ICD-10-CM | POA: Diagnosis not present

## 2015-07-20 DIAGNOSIS — R7303 Prediabetes: Secondary | ICD-10-CM | POA: Diagnosis not present

## 2015-07-24 DIAGNOSIS — R7309 Other abnormal glucose: Secondary | ICD-10-CM | POA: Diagnosis not present

## 2015-07-29 MED FILL — BUPROPION HCL XL 300 MG TAB: 300 | 90 days supply | Qty: 90 | Fill #2

## 2015-07-29 MED FILL — SERTRALINE HCL 100 MG TAB: 100 | 90 days supply | Qty: 90 | Fill #2

## 2015-07-30 MED FILL — PROGESTERONE 100 MG CAPSULE: 100 | 30 days supply | Qty: 30 | Fill #0

## 2015-07-30 MED FILL — MINIVELLE 0.1 MG PATCH: 0.1 | 28 days supply | Qty: 8 | Fill #0 | Status: TO

## 2015-09-07 MED FILL — MINIVELLE 0.1 MG PATCH: 0.1 | 28 days supply | Qty: 8 | Fill #1 | Status: TO

## 2015-09-25 MED FILL — ALLEGRA-D 24 HOUR TABLET: 180-240 | 30 days supply | Qty: 30 | Fill #0

## 2015-10-01 MED FILL — MINIVELLE 0.1 MG PATCH: 0.1 | 28 days supply | Qty: 8 | Fill #2 | Status: TO

## 2015-10-20 MED FILL — BUPROPION HCL XL 300 MG TAB: 300 | 90 days supply | Qty: 90 | Fill #3 | Status: TO

## 2015-11-12 MED FILL — ALLEGRA-D 24 HOUR TABLET: 180-240 | 30 days supply | Qty: 30 | Fill #1 | Status: TO

## 2015-11-25 MED FILL — MINIVELLE 0.1 MG PATCH: 0.1 | 28 days supply | Qty: 8 | Fill #3 | Status: TO

## 2015-11-25 MED FILL — SERTRALINE HCL 100 MG TAB: 100 | 30 days supply | Qty: 30 | Fill #3 | Status: TO

## 2018-08-10 MED FILL — buPROPion HCL ER (XL) 300 M: 300 | 30 days supply | Qty: 30 | Fill #0

## 2018-08-21 MED FILL — SERTRALINE HCL 100 MG TAB: 100 | 30 days supply | Qty: 30 | Fill #0

## 2018-09-14 MED FILL — buPROPion HCL ER (XL) 300 M: 300 | 30 days supply | Qty: 30 | Fill #1

## 2018-10-05 MED FILL — SERTRALINE HCL 100 MG TAB: 100 | 30 days supply | Qty: 30 | Fill #1

## 2018-10-09 DIAGNOSIS — R509 Fever, unspecified: Secondary | ICD-10-CM | POA: Diagnosis not present

## 2018-10-22 MED FILL — PROMETHAZINE 25 MG TABLET: 25 | 2 days supply | Qty: 10 | Fill #0

## 2018-10-24 DIAGNOSIS — E119 Type 2 diabetes mellitus without complications: Secondary | ICD-10-CM | POA: Diagnosis not present

## 2018-10-24 DIAGNOSIS — H8190 Unspecified disorder of vestibular function, unspecified ear: Secondary | ICD-10-CM | POA: Diagnosis not present

## 2018-10-24 DIAGNOSIS — R110 Nausea: Secondary | ICD-10-CM | POA: Diagnosis not present

## 2018-10-24 DIAGNOSIS — E860 Dehydration: Secondary | ICD-10-CM | POA: Diagnosis not present

## 2018-11-09 MED FILL — SERTRALINE HCL 100 MG TAB: 100 | 30 days supply | Qty: 30 | Fill #2

## 2018-11-09 MED FILL — buPROPion HCL ER (XL) 300 M: 300 | 30 days supply | Qty: 30 | Fill #2

## 2018-12-20 MED FILL — SERTRALINE HCL 100 MG TAB: 100 | 30 days supply | Qty: 30 | Fill #0

## 2018-12-20 MED FILL — buPROPion HCL ER (XL) 300 M: 300 | 30 days supply | Qty: 30 | Fill #0

## 2019-02-04 MED FILL — SERTRALINE HCL 100 MG TAB: 100 | 30 days supply | Qty: 30 | Fill #1

## 2019-02-04 MED FILL — buPROPion HCL ER (XL) 300 M: 300 | 30 days supply | Qty: 30 | Fill #1

## 2019-03-20 MED FILL — buPROPion HCL ER (XL) 300 M: 300 | 30 days supply | Qty: 30 | Fill #2

## 2019-03-20 MED FILL — SERTRALINE HCL 100 MG TAB: 100 | 30 days supply | Qty: 30 | Fill #2

## 2019-04-26 MED FILL — BUPROPION HCL ER (XL) 300 M: 300 | 30 days supply | Qty: 30 | Fill #3

## 2019-05-10 MED FILL — SERTRALINE HCL 100 MG TAB: 100 | 30 days supply | Qty: 30 | Fill #3

## 2019-06-07 MED FILL — BUPROPION HCL ER (XL) 300 M: 300 | 30 days supply | Qty: 30 | Fill #4

## 2019-06-25 MED FILL — SERTRALINE HCL 100 MG TAB: 100 | 30 days supply | Qty: 30 | Fill #4

## 2019-07-29 MED FILL — SERTRALINE HCL 100 MG TAB: 100 | 30 days supply | Qty: 30 | Fill #5

## 2019-07-29 MED FILL — buPROPion HCL ER (XL) 300 M: 300 | 30 days supply | Qty: 30 | Fill #5

## 2019-09-09 MED FILL — SERTRALINE HCL 100 MG TAB: 100 | 30 days supply | Qty: 30 | Fill #0

## 2019-09-09 MED FILL — buPROPion HCL ER (XL) 300 M: 300 | 30 days supply | Qty: 30 | Fill #0

## 2019-10-23 MED FILL — GLIMEPIRIDE 2 MG TABLET: 2 | 30 days supply | Qty: 60 | Fill #0

## 2019-10-25 MED FILL — SERTRALINE HCL 100 MG TAB: 100 | 30 days supply | Qty: 30 | Fill #0

## 2019-10-25 MED FILL — buPROPion HCL ER (XL) 300 M: 300 | 30 days supply | Qty: 30 | Fill #0

## 2019-11-19 ENCOUNTER — Other Ambulatory Visit (HOSPITAL_COMMUNITY): Payer: Self-pay | Admitting: Internal Medicine

## 2019-11-19 MED FILL — SERTRALINE HCL 100 MG TABS: 100 | 30 days supply | Qty: 30 | Fill #0

## 2019-11-19 MED FILL — GLIMEPIRIDE 2 MG TABLET: 2 | 30 days supply | Qty: 60 | Fill #0

## 2019-12-03 MED FILL — SERTRALINE HCL 100 MG TABS: 100 | 30 days supply | Qty: 30 | Fill #0

## 2019-12-03 MED FILL — GLIMEPIRIDE 2 MG TABLET: 2 | 30 days supply | Qty: 60 | Fill #1

## 2019-12-03 MED FILL — buPROPion HCL ER (XL) 300 M: 300 | 30 days supply | Qty: 30 | Fill #0

## 2020-01-06 MED FILL — SERTRALINE HCL 100 MG TABS: 100 | 30 days supply | Qty: 30 | Fill #1

## 2020-01-06 MED FILL — GLIMEPIRIDE 2 MG TABS: 2 | 30 days supply | Qty: 60 | Fill #2

## 2020-01-06 MED FILL — buPROPion HCL ER (XL) 300 M: 300 | 30 days supply | Qty: 30 | Fill #1

## 2020-02-10 ENCOUNTER — Other Ambulatory Visit (HOSPITAL_COMMUNITY): Payer: Self-pay | Admitting: Obstetrics & Gynecology

## 2020-02-15 MED FILL — SERTRALINE HCL 100 MG TABS: 100 | 30 days supply | Qty: 30 | Fill #2

## 2020-02-15 MED FILL — GLIMEPIRIDE 2 MG TABS: 2 | 30 days supply | Qty: 60 | Fill #3

## 2020-02-15 MED FILL — FLUCONAZOLE 150 MG TABS: 150 | 6 days supply | Qty: 3 | Fill #0

## 2020-02-15 MED FILL — buPROPion HCL ER (XL) 300 M: 300 | 30 days supply | Qty: 30 | Fill #2

## 2020-03-18 MED FILL — buPROPion HCL ER (XL) 300 M: 300 | 30 days supply | Qty: 30 | Fill #3

## 2020-03-18 MED FILL — GLIMEPIRIDE 2 MG TABS: 2 | 30 days supply | Qty: 60 | Fill #4

## 2020-03-18 MED FILL — SERTRALINE HCL 100 MG TABS: 100 | 30 days supply | Qty: 30 | Fill #3

## 2020-04-30 MED FILL — SERTRALINE HCL 100 MG TABS: 100 | 30 days supply | Qty: 30 | Fill #4

## 2020-04-30 MED FILL — buPROPion HCL ER (XL) 300 M: 300 | 30 days supply | Qty: 30 | Fill #4

## 2020-04-30 MED FILL — GLIMEPIRIDE 2 MG TABS: 2 | 30 days supply | Qty: 60 | Fill #5

## 2020-04-30 MED FILL — FLUCONAZOLE 150 MG TABS: 150 | 6 days supply | Qty: 3 | Fill #1

## 2020-06-04 MED FILL — FLUCONAZOLE 150 MG TABS: 150 | 6 days supply | Qty: 3 | Fill #2

## 2020-06-04 MED FILL — SERTRALINE HCL 100 MG TABS: 100 | 30 days supply | Qty: 30 | Fill #5

## 2020-06-04 MED FILL — buPROPion HCL ER (XL) 300 M: 300 | 30 days supply | Qty: 30 | Fill #5

## 2020-07-09 MED FILL — buPROPion HCL ER (XL) 300 M: 300 | 30 days supply | Qty: 30 | Fill #6

## 2020-07-09 MED FILL — GLIMEPIRIDE 2 MG TABS: 2 | 30 days supply | Qty: 60 | Fill #0

## 2020-07-09 MED FILL — SERTRALINE HCL 100 MG TABS: 100 | 30 days supply | Qty: 30 | Fill #6

## 2021-05-07 ENCOUNTER — Other Ambulatory Visit (HOSPITAL_COMMUNITY): Payer: Self-pay

## 2021-05-07 MED ORDER — FLUCONAZOLE 150 MG PO TABS
ORAL_TABLET | ORAL | 0 refills | Status: AC
  Filled 2021-05-07: qty 1, 1d supply, fill #0

## 2021-05-08 ENCOUNTER — Other Ambulatory Visit (HOSPITAL_COMMUNITY): Payer: Self-pay

## 2021-05-19 ENCOUNTER — Other Ambulatory Visit (HOSPITAL_COMMUNITY): Payer: Self-pay

## 2021-07-14 DIAGNOSIS — L719 Rosacea, unspecified: Secondary | ICD-10-CM | POA: Diagnosis not present

## 2022-04-18 ENCOUNTER — Other Ambulatory Visit (HOSPITAL_COMMUNITY): Payer: Self-pay

## 2022-04-18 DIAGNOSIS — E1165 Type 2 diabetes mellitus with hyperglycemia: Secondary | ICD-10-CM | POA: Diagnosis not present

## 2022-04-18 DIAGNOSIS — Z0000 Encounter for general adult medical examination without abnormal findings: Secondary | ICD-10-CM | POA: Diagnosis not present

## 2022-04-18 MED ORDER — ROSUVASTATIN CALCIUM 5 MG PO TABS
5.0000 mg | ORAL_TABLET | Freq: Every day | ORAL | 4 refills | Status: AC
  Filled 2022-04-18: qty 90, 90d supply, fill #0

## 2022-04-18 MED ORDER — GLIMEPIRIDE 2 MG PO TABS
2.0000 mg | ORAL_TABLET | Freq: Two times a day (BID) | ORAL | 3 refills | Status: AC
  Filled 2022-04-18: qty 180, 90d supply, fill #0

## 2022-04-18 MED ORDER — FEXOFENADINE-PSEUDOEPHED ER 180-240 MG PO TB24
1.0000 | ORAL_TABLET | Freq: Every day | ORAL | 3 refills | Status: AC | PRN
  Filled 2022-04-18 – 2022-06-26 (×2): qty 90, 90d supply, fill #0
  Filled 2022-07-01: qty 30, 30d supply, fill #0
  Filled 2022-09-28: qty 30, 30d supply, fill #1
  Filled 2022-11-07: qty 30, 30d supply, fill #2
  Filled 2022-12-06: qty 30, 30d supply, fill #3

## 2022-04-18 MED ORDER — BUPROPION HCL ER (XL) 300 MG PO TB24
300.0000 mg | ORAL_TABLET | Freq: Every morning | ORAL | 4 refills | Status: AC
  Filled 2022-04-18: qty 90, 90d supply, fill #0

## 2022-04-18 MED ORDER — SERTRALINE HCL 100 MG PO TABS
100.0000 mg | ORAL_TABLET | Freq: Every evening | ORAL | 4 refills | Status: AC
  Filled 2022-04-18: qty 90, 90d supply, fill #0

## 2022-04-19 ENCOUNTER — Other Ambulatory Visit (HOSPITAL_COMMUNITY): Payer: Self-pay

## 2022-04-27 ENCOUNTER — Other Ambulatory Visit (HOSPITAL_COMMUNITY): Payer: Self-pay

## 2022-04-30 ENCOUNTER — Other Ambulatory Visit (HOSPITAL_COMMUNITY): Payer: Self-pay

## 2022-05-10 ENCOUNTER — Other Ambulatory Visit (HOSPITAL_COMMUNITY): Payer: Self-pay

## 2022-05-26 ENCOUNTER — Other Ambulatory Visit (HOSPITAL_COMMUNITY): Payer: Self-pay

## 2022-05-26 MED ORDER — METFORMIN HCL 500 MG PO TABS
500.0000 mg | ORAL_TABLET | Freq: Two times a day (BID) | ORAL | 3 refills | Status: AC
  Filled 2022-05-26: qty 180, 90d supply, fill #0
  Filled 2022-09-28: qty 60, 30d supply, fill #1
  Filled 2022-11-02: qty 60, 30d supply, fill #2
  Filled 2022-12-06: qty 60, 30d supply, fill #3

## 2022-06-04 ENCOUNTER — Other Ambulatory Visit (HOSPITAL_COMMUNITY): Payer: Self-pay

## 2022-06-26 ENCOUNTER — Other Ambulatory Visit: Payer: Self-pay

## 2022-07-01 ENCOUNTER — Other Ambulatory Visit: Payer: Self-pay

## 2022-09-28 ENCOUNTER — Other Ambulatory Visit (HOSPITAL_COMMUNITY): Payer: Self-pay

## 2022-09-29 ENCOUNTER — Other Ambulatory Visit: Payer: Self-pay

## 2022-11-02 ENCOUNTER — Other Ambulatory Visit (HOSPITAL_COMMUNITY): Payer: Self-pay

## 2022-11-03 ENCOUNTER — Other Ambulatory Visit: Payer: Self-pay

## 2022-11-05 ENCOUNTER — Other Ambulatory Visit (HOSPITAL_COMMUNITY): Payer: Self-pay

## 2022-11-07 ENCOUNTER — Other Ambulatory Visit (HOSPITAL_COMMUNITY): Payer: Self-pay

## 2022-11-08 ENCOUNTER — Other Ambulatory Visit (HOSPITAL_COMMUNITY): Payer: Self-pay

## 2022-11-10 ENCOUNTER — Other Ambulatory Visit (HOSPITAL_COMMUNITY): Payer: Self-pay

## 2022-11-22 ENCOUNTER — Other Ambulatory Visit (HOSPITAL_COMMUNITY): Payer: Self-pay

## 2022-11-23 ENCOUNTER — Other Ambulatory Visit (HOSPITAL_COMMUNITY): Payer: Self-pay

## 2022-12-03 ENCOUNTER — Other Ambulatory Visit (HOSPITAL_COMMUNITY): Payer: Self-pay

## 2022-12-06 ENCOUNTER — Other Ambulatory Visit (HOSPITAL_COMMUNITY): Payer: Self-pay

## 2023-01-11 ENCOUNTER — Other Ambulatory Visit (HOSPITAL_COMMUNITY): Payer: Self-pay

## 2024-06-11 ENCOUNTER — Other Ambulatory Visit (HOSPITAL_COMMUNITY): Payer: Self-pay | Admitting: Internal Medicine

## 2024-06-11 DIAGNOSIS — R61 Generalized hyperhidrosis: Secondary | ICD-10-CM

## 2024-06-11 DIAGNOSIS — R634 Abnormal weight loss: Secondary | ICD-10-CM

## 2024-06-15 ENCOUNTER — Ambulatory Visit (HOSPITAL_BASED_OUTPATIENT_CLINIC_OR_DEPARTMENT_OTHER): Payer: Self-pay

## 2024-06-15 DIAGNOSIS — R61 Generalized hyperhidrosis: Secondary | ICD-10-CM | POA: Insufficient documentation

## 2024-06-15 DIAGNOSIS — R634 Abnormal weight loss: Secondary | ICD-10-CM | POA: Insufficient documentation

## 2024-06-15 MED ORDER — IOHEXOL 350 MG/ML SOLN
75.0000 mL | Freq: Once | INTRAVENOUS | Status: AC | PRN
  Administered 2024-06-15: 75 mL via INTRAVENOUS

## 2024-06-19 LAB — POCT I-STAT CREATININE: Creatinine, Ser: 0.8 mg/dL (ref 0.44–1.00)
